# Patient Record
Sex: Male | Born: 1965 | Race: Black or African American | Hispanic: No | Marital: Married | Smoking: Current every day smoker
Health system: Southern US, Community
[De-identification: ages and names within clinical notes are randomized; demographics above are authoritative.]

---

## 2001-01-01 ENCOUNTER — Emergency Department (HOSPITAL_COMMUNITY): Admission: EM | Admit: 2001-01-01 | Discharge: 2001-01-01 | Payer: Self-pay | Admitting: *Deleted

## 2005-08-25 ENCOUNTER — Ambulatory Visit: Payer: Self-pay | Admitting: Family Medicine

## 2005-08-25 ENCOUNTER — Ambulatory Visit (HOSPITAL_COMMUNITY): Admission: RE | Admit: 2005-08-25 | Discharge: 2005-08-25 | Payer: Self-pay | Admitting: Family Medicine

## 2007-02-02 IMAGING — CR DG CHEST 2V
2 series · 2 of 2 positions shown · non-contrast
Comparison: none

HISTORY: Cough, congestion, wheezing

CHEST 2 VIEWS:
No prior study for comparison
Normal heart size, mediastinal contours, and vascularity.
Minimal bronchitic changes
No infiltrate, effusion, or pneumothorax.
Bones unremarkable.

[view not recorded (1 of 2)]
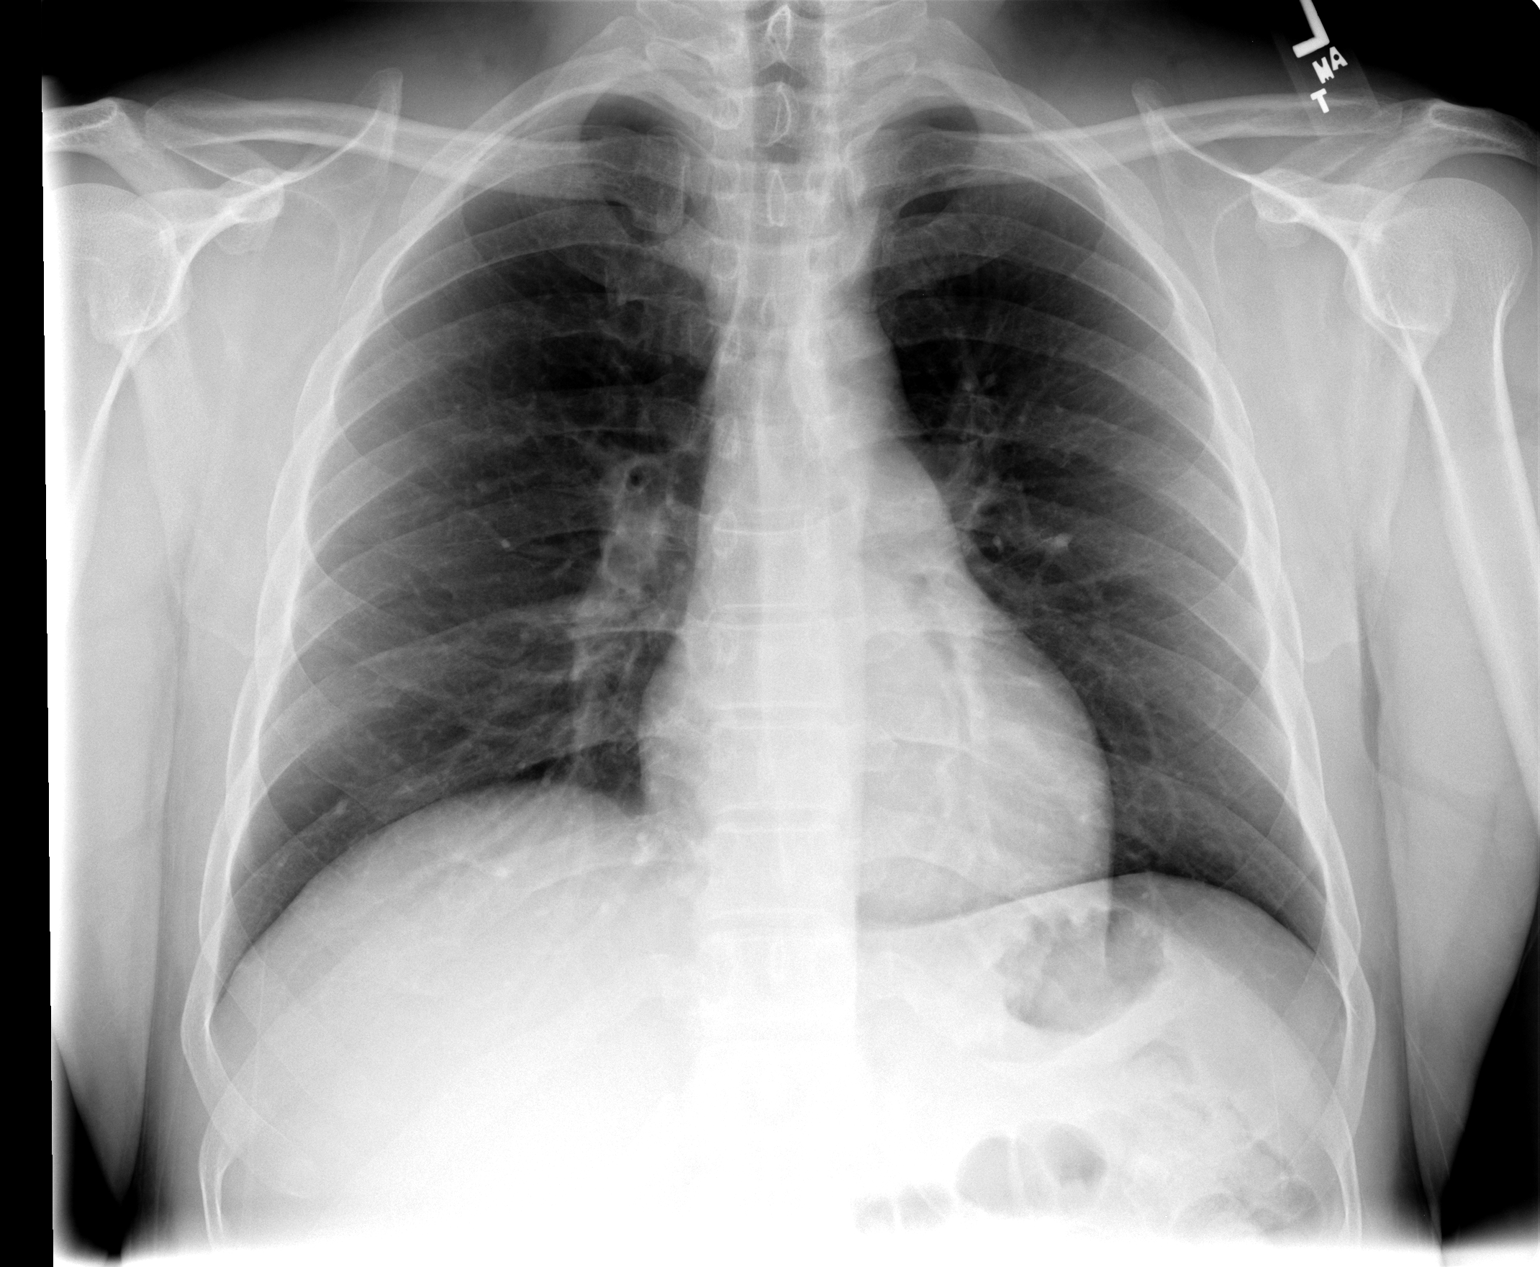

[view not recorded (2 of 2)]
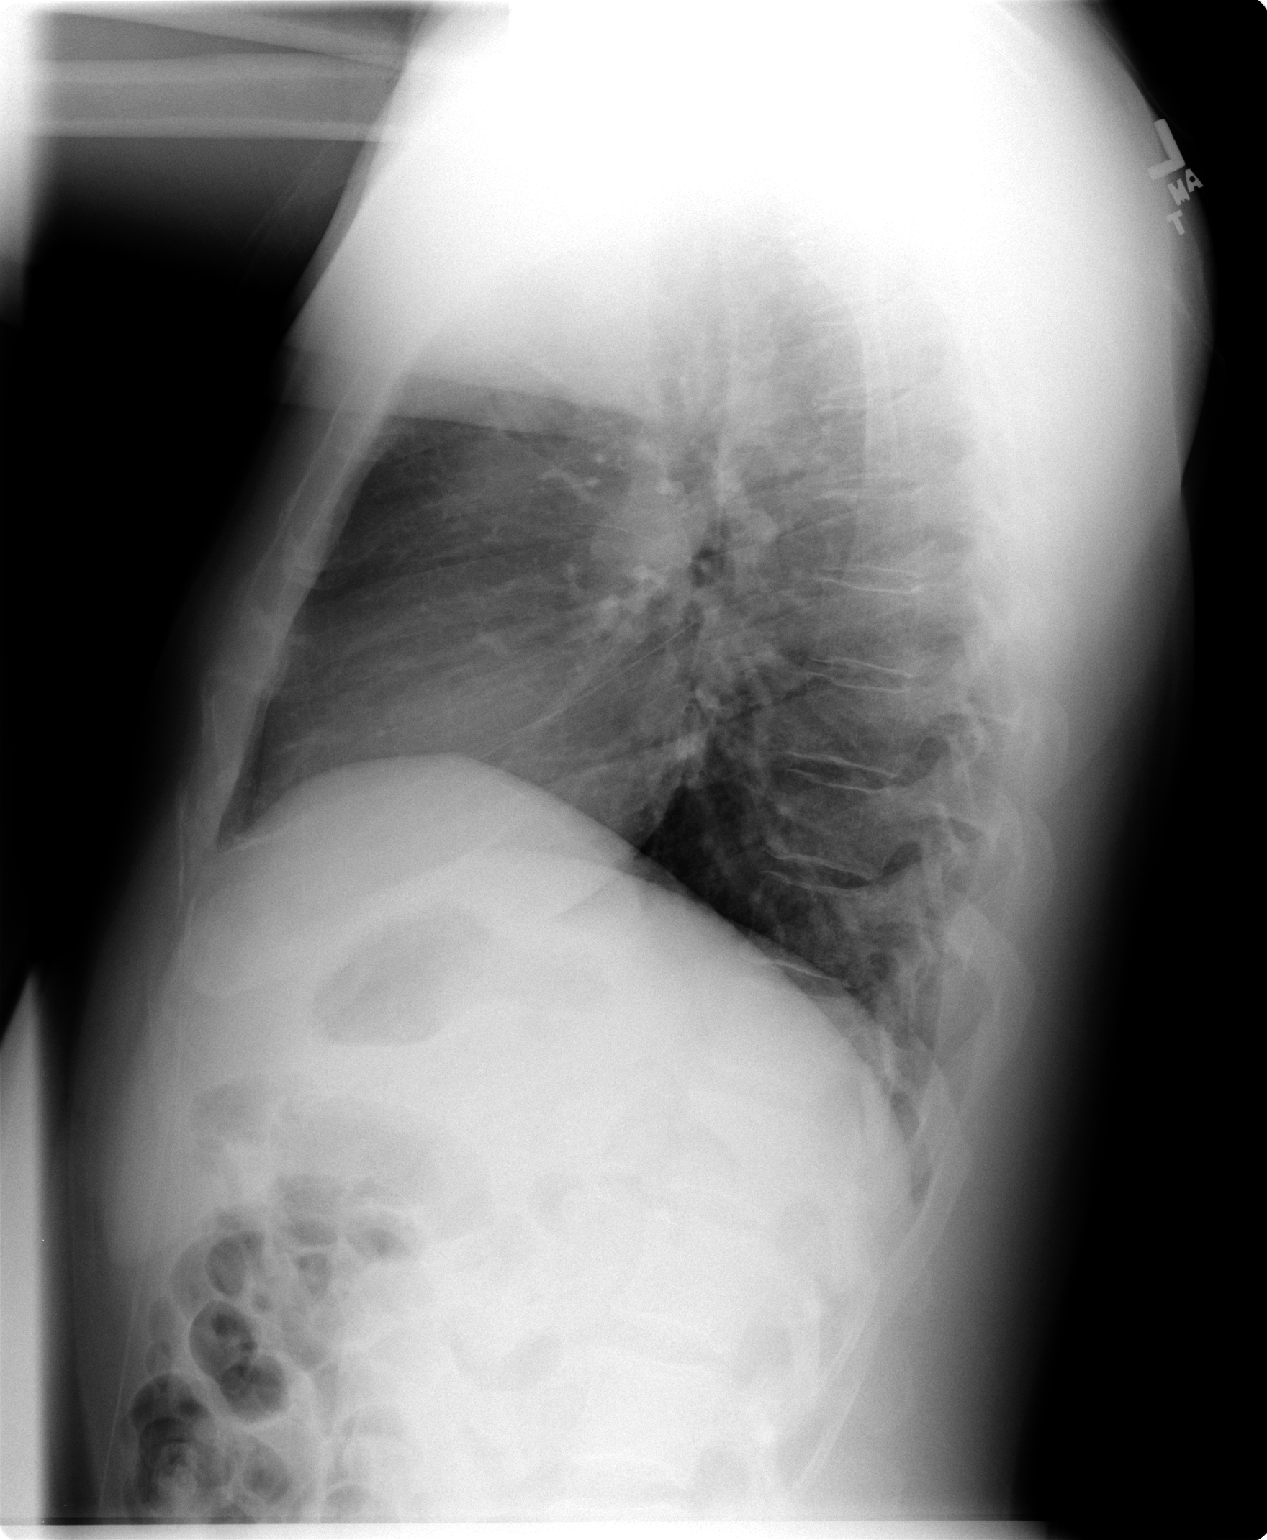

[2 of 2 positions shown; findings below may reference images not displayed]

IMPRESSION: Minimal bronchitic changes without infiltrate.

## 2007-09-19 ENCOUNTER — Encounter: Payer: Self-pay | Admitting: Family Medicine

## 2010-10-20 NOTE — Letter (Signed)
Summary: rpc chart  rpc chart   Imported By: Curtis Sites 06/30/2010 15:25:08  _____________________________________________________________________  External Attachment:    Type:   Image     Comment:   External Document

## 2015-06-03 ENCOUNTER — Encounter (HOSPITAL_COMMUNITY): Payer: Self-pay

## 2015-06-03 ENCOUNTER — Emergency Department (HOSPITAL_COMMUNITY)
Admission: EM | Admit: 2015-06-03 | Discharge: 2015-06-03 | Disposition: A | Discharge status: Home / Self Care | Payer: Self-pay | Attending: Emergency Medicine | Admitting: Emergency Medicine

## 2015-06-03 DIAGNOSIS — K029 Dental caries, unspecified: Secondary | ICD-10-CM | POA: Insufficient documentation

## 2015-06-03 DIAGNOSIS — Z720 Tobacco use: Secondary | ICD-10-CM | POA: Insufficient documentation

## 2015-06-03 DIAGNOSIS — K088 Other specified disorders of teeth and supporting structures: Secondary | ICD-10-CM | POA: Insufficient documentation

## 2015-06-03 DIAGNOSIS — R202 Paresthesia of skin: Secondary | ICD-10-CM | POA: Insufficient documentation

## 2015-06-03 DIAGNOSIS — K0889 Other specified disorders of teeth and supporting structures: Secondary | ICD-10-CM

## 2015-06-03 MED ORDER — PENICILLIN V POTASSIUM 500 MG PO TABS: 500.0000 mg | ORAL_TABLET | Freq: Four times a day (QID) | ORAL | Status: AC

## 2015-06-03 MED ORDER — PENICILLIN V POTASSIUM 250 MG PO TABS
500.0000 mg | ORAL_TABLET | Freq: Once | ORAL | Status: AC
  Administered 2015-06-03: 500 mg via ORAL
  Filled 2015-06-03: qty 2

## 2015-06-03 MED ORDER — TRAMADOL HCL 50 MG PO TABS
50.0000 mg | ORAL_TABLET | Freq: Once | ORAL | Status: AC
  Administered 2015-06-03: 50 mg via ORAL
  Filled 2015-06-03: qty 1

## 2015-06-03 MED ORDER — TRAMADOL HCL 50 MG PO TABS: 50.0000 mg | ORAL_TABLET | Freq: Four times a day (QID) | ORAL | Status: DC | PRN

## 2015-06-03 NOTE — ED Notes (Addendum)
Pt reports he's had a toothache X2dys and this morning his left arm went numb. No neuro deficits

## 2015-06-03 NOTE — ED Provider Notes (Signed)
CSN: [id number]     Arrival date & time 06/03/15  4098 History  This chart was scribed for Justin Octave, MD by Phillis Haggis, ED Scribe. This patient was seen in room APA08/APA08 and patient care was started at 10:27 AM.    Chief Complaint  Patient presents with  . Dental Pain   The history is provided by the patient. No language interpreter was used.   HPI Comments: Justin Compton is a 49 y.o. male who presents to the Emergency Department complaining of upper and lower dental pain onset two days ago. Pt reports associated subjective fever and reports constant tingling of the middle, ring and pinky fingers of the left hand onset this morning. Says that he woke up with the tingling in his fingers and did not experience tingling last night. Reports taking Advil for tooth pain to no relief. He states that he is right hand dominant. Pt denies having a dentist. Pt denies fall, injury, hx of DM, HTN, blurred vision, neck pain, weakness, chest pain, nausea, vomiting, SOB, chills, headache, or dizziness. Denies allergies to medications.   History reviewed. No pertinent past medical history. History reviewed. No pertinent past surgical history. No family history on file. Social History  Substance Use Topics  . Smoking status: Current Every Day Smoker -- 1.00 packs/day    Types: Cigarettes  . Smokeless tobacco: None  . Alcohol Use: Yes    Review of Systems A complete 10 system review of systems was obtained and all systems are negative except as noted in the HPI and PMH.   Allergies  Review of patient's allergies indicates no known allergies.  Home Medications   Prior to Admission medications   Medication Sig Start Date End Date Taking? Authorizing Provider  penicillin v potassium (VEETID) 500 MG tablet Take 1 tablet (500 mg total) by mouth 4 (four) times daily. 06/03/15 06/10/15  Justin Octave, MD  traMADol (ULTRAM) 50 MG tablet Take 1 tablet (50 mg total) by mouth every 6 (six) hours  as needed. 06/03/15   Justin Octave, MD   BP 164/119 mmHg  Pulse 77  Temp(Src) 99.1 F (37.3 C) (Oral)  Resp 18  Ht  (1.727 m)  Wt 170 lb (77.111 kg)  BMI 25.85 kg/m2  SpO2 100%  Physical Exam  Constitutional: He is oriented to person, place, and time. He appears well-developed and well-nourished. No distress.  HENT:  Head: Normocephalic and atraumatic.  Mouth/Throat: Oropharynx is clear and moist. No oropharyngeal exudate.  Right upper and lower molar with caries, no abscess. Floor of mouth soft. Able to touch tongue to roof of mouth.   Eyes: Conjunctivae and EOM are normal. Pupils are equal, round, and reactive to light.  Neck: Normal range of motion. Neck supple.  No meningismus.  Cardiovascular: Normal rate, regular rhythm, normal heart sounds and intact distal pulses.   No murmur heard. Pulmonary/Chest: Effort normal and breath sounds normal. No respiratory distress.  Abdominal: Soft. There is no tenderness. There is no rebound and no guarding.  Musculoskeletal: Normal range of motion. He exhibits no edema or tenderness.  Neurological: He is alert and oriented to person, place, and time. No cranial nerve deficit. He exhibits normal muscle tone. Coordination normal.  No ataxia on finger to nose bilaterally. No pronator drift. 5/5 strength throughout. CN 2-12 intact. Negative Romberg. Equal grip strength. Sensation intact. Gait is normal. Intact radial pulse, full ROM of left wrist, Cardinal hand movements intact. Subjective paresthesias to left 4th and 5th  digits only.   Skin: Skin is warm.  Psychiatric: He has a normal mood and affect. His behavior is normal.  Nursing note and vitals reviewed.   ED Course  Procedures (including critical care time) DIAGNOSTIC STUDIES: Oxygen Saturation is 100% on RA, normal by my interpretation.    COORDINATION OF CARE: 10:32 AM-Discussed treatment plan which includes pain medication, anti-biotics and follow up with dentist with pt at  bedside and pt agreed to plan.   Labs Review Labs Reviewed - No data to display  Imaging Review No results found.    EKG Interpretation None      MDM   Final diagnoses:  Pain, dental  Paresthesias   right-sided toothache for 2 days. No fever or vomiting. This morning started to notice tingling in the fourth and fifth fingers of his left hand. No trauma. Tingling does not extend past hand. There is no weakness. No neck, head or back pain.  No evidence of ludwig's angina or abscess.  Tingling in left hand appears to be peripheral. Doubt ACS, doubt stroke, doubt TIA.  Patient needs to follow-up with dentistry. Return Precautions discussed.   I personally performed the services described in this documentation, which was scribed in my presence. The recorded information has been reviewed and is accurate.    Justin Octave, MD 06/03/15 [id number]

## 2015-06-03 NOTE — Discharge Instructions (Signed)
Dental Pain °A tooth ache may be caused by cavities (tooth decay). Cavities expose the nerve of the tooth to air and hot or cold temperatures. It may come from an infection or abscess (also called a boil or furuncle) around your tooth. It is also often caused by dental caries (tooth decay). This causes the pain you are having. °DIAGNOSIS  °Your caregiver can diagnose this problem by exam. °TREATMENT  °· If caused by an infection, it may be treated with medications which kill germs (antibiotics) and pain medications as prescribed by your caregiver. Take medications as directed. °· Only take over-the-counter or prescription medicines for pain, discomfort, or fever as directed by your caregiver. °· Whether the tooth ache today is caused by infection or dental disease, you should see your dentist as soon as possible for further care. °SEEK MEDICAL CARE IF: °The exam and treatment you received today has been provided on an emergency basis only. This is not a substitute for complete medical or dental care. If your problem worsens or new problems (symptoms) appear, and you are unable to meet with your dentist, call or return to this location. °SEEK IMMEDIATE MEDICAL CARE IF:  °· You have a fever. °· You develop redness and swelling of your face, jaw, or neck. °· You are unable to open your mouth. °· You have severe pain uncontrolled by pain medicine. °MAKE SURE YOU:  °· Understand these instructions. °· Will watch your condition. °· Will get help right away if you are not doing well or get worse. °Document Released: 09/04/2005 Document Revised: 11/27/2011 Document Reviewed: 04/22/2008 °ExitCare® Patient Information ©2015 [employer]. This information is not intended to replace advice given to you by your health care provider. Make sure you discuss any questions you have with your health care provider. ° °Emergency Department Resource Guide °1) Find a Doctor and Pay Out of Pocket °Although you won't have to find out who  is covered by your insurance plan, it is a good idea to ask around and get recommendations. You will then need to call the office and see if the doctor you have chosen will accept you as a new patient and what types of options they offer for patients who are self-pay. Some doctors offer discounts or will set up payment plans for their patients who do not have insurance, but you will need to ask so you aren't surprised when you get to your appointment. ° °2) Contact Your Local Health Department °Not all health departments have doctors that can see patients for sick visits, but many do, so it is worth a call to see if yours does. If you don't know where your local health department is, you can check in your phone book. The CDC also has a tool to help you locate your state's health department, and many state websites also have listings of all of their local health departments. ° °3) Find a Walk-in Clinic °If your illness is not likely to be very severe or complicated, you may want to try a walk in clinic. These are popping up all over the country in pharmacies, drugstores, and shopping centers. They're usually staffed by nurse practitioners or physician assistants that have been trained to treat common illnesses and complaints. They're usually fairly quick and inexpensive. However, if you have serious medical issues or chronic medical problems, these are probably not your best option. ° °No Primary Care Doctor: °- Call [facility] at  832-8000 - they can help you locate a primary   care doctor that  accepts your insurance, provides certain services, etc. °- Physician Referral Service- 1-800-533-3463 ° °Chronic Pain Problems: °Organization         Address  Phone   Notes  °[facility]  (336) 297-2271 Patients need to be referred by their primary care doctor.  ° °Medication Assistance: °Organization         Address  Phone   Notes  °[place] [facility] [address]  [address] °[place], [place] [address] (336) 641-8030 --Must be a resident of [place] °-- Must have NO insurance coverage whatsoever (no Medicaid/ Medicare, etc.) °-- The pt. MUST have a primary care doctor that directs their care regularly and follows them in the community °  °[facility]  (866) 331-1348   °[employer]  (888) 892-1162   ° °Agencies that provide inexpensive medical care: °Organization         Address  Phone   Notes  °[facility]  (336) 832-8035   °[facility]    (336) 832-7272   °[facility] [address] °[place], [place] [address] (336) 832-4777   °[facility] [address], °[place] (336) 271-4999   °[facility]    (336) 373-0678   °[facility]    (336) 272-1050   °[facility] ° [address], [place] Phone:  (336) 832-4444, Fax:  (336) 832-4440 Hours of Operation:  9 am - 6 pm, M-F.  Also accepts Medicaid/Medicare and self-pay.  °[facility] ° [address], [place] Phone: (336) 832-3150, Fax: (336) 832-3151. Hours of Operation:  8:30 am - 5:30 pm, M-F.  Also accepts Medicaid and self-pay.  °[facility] [place] [address], [place] Phone: (336) 878-6027   °[facility] [address], [place], [place] (336)723-1848, Ext. 123 Mondays & Thursdays: 7-9 AM.  First 15 patients are seen on a first come, first serve basis. °  ° °Medicaid-accepting [place] Providers: ° °Organization         Address  Phone   Notes  °[facility] [address], [place] (336) 641-2100 Also accepts self-pay patients.  °[facility] [address], [place] ° (336) 856-9996   °[facility] [address], [place] (336) 288-8857   °[facility] [address] [place] [address], [place] (336) 299-7000   °Veita Bland [address], [place]  ° (336) 373-1557 Only accepts [place] Access Medicaid patients after they have their name applied to their card.  ° °Self-Pay (no insurance) in [place]: ° °Organization         Address  Phone   Notes  °Sickle Cell Patients, [facility] [address], [place] (336) 832-1970   °[facility] [address], [place] (336) 832-4400   °[facility] ° [address], [place] (336) 992-4800   °[facility]/Dr. Osei-Bonsu ° [address] [place] [address], [place] or [address], [place] (336) 841-8500 Phone number for both [place] and [place] locations is the same.  °[facility] [address], [place] (336) 299-0000   °[facility]  [address] [place] [address],  or [address] (336) 852-7530 °(336) 878-2260   °  [facility] [address], [place] (336) 350-1642, phone; (336) 294-5005, fax Sees patients 1st and 3rd Saturday of every month.  Must not qualify for public or private insurance (i.e. Medicaid, Medicare, [place] Health Choice, Veterans' Benefits) • Household income should be no more than 200% of the poverty level •The clinic cannot treat you if you are pregnant or think you are pregnant • Sexually transmitted diseases are not treated at the clinic.  ° ° °Dental Care: °Organization         Address  Phone  Notes  °[facility] [address], [place] (336) 641-6152 Accepts children up to age 21 who are enrolled in Medicaid or [place] Health Choice; pregnant women with a Medicaid card; and children who have applied for Medicaid or [place] Health Choice, but were declined, whose parents can pay a reduced fee at time of service.  °[facility] [place]  [address], [place] (336) 641-7733 Accepts children up to age 21 who are  enrolled in Medicaid or [place] Health Choice; pregnant women with a Medicaid card; and children who have applied for Medicaid or [place] Health Choice, but were declined, whose parents can pay a reduced fee at time of service.  °[facility] ° [address], [place] (336) 641-4533 Patients are seen by appointment only. Walk-ins are not accepted. [facility] will see patients 18 years of age and older. °Monday - Tuesday (8am-5pm) °Most Wednesdays (8:30-5pm) °$30 per visit, cash only  °[facility] ° [address], [place] (336) 641-4533 Patients are seen by appointment only. Walk-ins are not accepted. [facility] will see patients 18 years of age and older. °One Wednesday Evening (Monthly: Volunteer Based).  $30 per visit, cash only  °[facility]  (919) 537-3737 for adults; Children under age 4, call Graduate Pediatric Dentistry at (919) [phone]. Children aged 4-14, please call (919) [phone] to request a pediatric application. ° Dental services are provided in all areas of dental care including fillings, crowns and bridges, complete and partial dentures, implants, gum treatment, root canals, and extractions. Preventive care is also provided. Treatment is provided to both adults and children. °Patients are selected via a lottery and there is often a waiting list. °  °[facility] [address], ° ° (336) 763-8833 [email or web address] °  °[facility] [address], [place], [place] (336)723-1848, Ext. 123 Second and Fourth Thursday of each month, opens at 6:30 AM; Clinic ends at 9 AM.  Patients are seen on a first-come first-served basis, and a limited number are seen during each clinic.  ° °[facility] ° [address], [place], [place] (336) 723-7904   Eligibility Requirements °You must have lived in [place], [place], or [place] counties for at least the last three months. °  You  cannot be eligible for state or federal sponsored healthcare insurance, including Veterans Administration, Medicaid, or Medicare. °  You generally cannot be eligible for healthcare insurance through your employer.  °  How to apply: °Eligibility screenings are held every Tuesday and Wednesday afternoon from 1:00 pm until 4:00 pm. You do not need an appointment for the interview!  °[facility] [address], [place], [place] 336-631-2330   °[facility] [facility]  336-342-8273   °[facility]  336-703-3100   °[facility]  336-570-6415   ° °Behavioral Health Resources in the Community: °Intensive Outpatient Programs °Organization         Address  Phone  Notes  °[facility] [address], [place], [place] 336-878-6098   °[facility] [address], [place], [place] 336-832-9800   °[facility] [address], [place], [place] ° 336-882-2125   °[facility] [address],  °[place], [place] 1-800-853-5163 or 336-641-4981   °Substance Abuse Resources °Organization         Address  Phone  Notes  °[facility]  336-882-2125   °[facility]  336-784-9470   °[facility]  336-285-9073   °[facility]  336-845-3988   °Residential & Outpatient Substance Abuse Program  1-800-659-3381   °Psychological Services °Organization         Address  Phone  Notes  °[facility]  336- 832-9600   °[facility]  336- 378-7881   °[facility] [address], [place] 1-800-853-5163 or 336-641-4981   ° °Mobile Crisis Teams °Organization         Address  Phone  Notes  °[facility], Mobile Crisis Care Unit  1-877-626-1772   °Assertive °[facility] ° [address] [place], [place] 336-834-9664   °Sharon DeEsch [address] °[place] 336-554-5454   ° °Self-Help/Support  Groups °Organization         Address  Phone             Notes  °[facility] - variety of support groups  336- 373-1402 Call for more information  °Narcotics Anonymous (NA), [facility] [address], °[place]  2 meetings at this location  ° °Residential Treatment Programs °Organization         Address  Phone  Notes  °[facility] [address],    °[place]  1-866-801-8205   °[facility] ° [address], [place], [place] 704-293-8524   °[facility] [address], [place] 336-845-3988 Admissions: 8am-3pm M-F  °[facility] [address]    °[place], [place] 336-841-1104   °[facility] [address], [place], [place] 336-379-7146   °[facility] [address]  °[place], [place] 336-285-9073   °[facility] - Intensive Outpatient [address], [place], [place] 336-852-3033   °[facility] [address]  °[place], [place] 1-877-615-2722 or 336-784-9470   °[facility] [address] [place], [place] 336-227-7417 Accepts Medicaid  °[facility] [address]  ° [place] 1-800-659-3381 Substance Abuse/Addiction Treatment  ° °[place] Behavioral Health Resources °Organization         Address  Phone  Notes  °[facility]  (888) 581-9988   °Julie Brannon, PhD [address] [place], [place]   (336) 349-5553 or (336) 951-0000   °[facility]   [address] °[place], [place] (336) 349-4454   °[facility] [address], [place], [place] (336) 342-8316 Insurance/Medicaid/sponsorship through [facility]  °[facility] [address]                                    [place],  (336) 342-8316 Therapy/tele-psych/case  °[facility]   [address]  ° [place], [place] (336) 349-2233    °Dr. Arfeen  (336) 349-4544   °[facility] [place]  [facility]  [employer] [place] [facility] 1) [address], [place] °2) [address], [place] °3)  [address], [place] (336) 349-3220 °(336) 342-7768 ° °(336) 342-8140   °[place] Child Abuse [facility] (336) 342-1394 or (336) 342-3537 (After Hours)    ° ° ° °

## 2020-01-28 ENCOUNTER — Ambulatory Visit
Admission: EM | Admit: 2020-01-28 | Discharge: 2020-01-28 | Disposition: A | Discharge status: Home / Self Care | Payer: BC Managed Care – PPO | Attending: Family Medicine | Admitting: Family Medicine

## 2020-01-28 ENCOUNTER — Other Ambulatory Visit: Payer: Self-pay

## 2020-01-28 DIAGNOSIS — R197 Diarrhea, unspecified: Secondary | ICD-10-CM

## 2020-01-28 DIAGNOSIS — R5383 Other fatigue: Secondary | ICD-10-CM

## 2020-01-28 DIAGNOSIS — R6883 Chills (without fever): Secondary | ICD-10-CM

## 2020-01-28 DIAGNOSIS — R1084 Generalized abdominal pain: Secondary | ICD-10-CM

## 2020-01-28 DIAGNOSIS — K529 Noninfective gastroenteritis and colitis, unspecified: Secondary | ICD-10-CM

## 2020-01-28 DIAGNOSIS — R509 Fever, unspecified: Secondary | ICD-10-CM

## 2020-01-28 MED ORDER — ONDANSETRON HCL 4 MG PO TABS: 4.0000 mg | ORAL_TABLET | Freq: Four times a day (QID) | ORAL | 0 refills | Status: DC

## 2020-01-28 MED ORDER — LOPERAMIDE HCL 2 MG PO CAPS: 2.0000 mg | ORAL_CAPSULE | Freq: Four times a day (QID) | ORAL | 0 refills | Status: DC | PRN

## 2020-01-28 NOTE — ED Provider Notes (Signed)
[facility]   [id number] 01/28/20 Arrival Time: 0820  CC: ABDOMINAL PAIN  SUBJECTIVE:  Justin Compton is a 54 y.o. male who presents with complaint of abdominal discomfort that began abruptly 4 days ago.  Denies a precipitating event, trauma, recent travel or antibiotic use. Reports sick contact (daughter that lives in the home). Describes as intermittent and dull/achy in character.  Has not tried OTC medications. Denies alleviating or aggravating factors.  Denies similar symptoms in the past. Last BM today. Reports chills, nausea, vomiting, diarrhea.    Denies fever, appetite changes, weight changes, chest pain, SOB, constipation, hematochezia, melena, dysuria, difficulty urinating, increased frequency or urgency, flank pain, loss of bowel or bladder function, vaginal discharge, vaginal odor, vaginal bleeding, dyspareunia, pelvic pain.     No LMP for male patient.  ROS: As per HPI.  All other pertinent ROS negative.     No past medical history on file. No past surgical history on file. No Known Allergies No current facility-administered medications on file prior to encounter.   Current Outpatient Medications on File Prior to Encounter  Medication Sig Dispense Refill  . traMADol (ULTRAM) 50 MG tablet Take 1 tablet (50 mg total) by mouth every 6 (six) hours as needed. 15 tablet 0   Social History   Socioeconomic History  . Marital status: Married    Spouse name: Not on file  . Number of children: Not on file  . Years of education: Not on file  . Highest education level: Not on file  Occupational History  . Not on file  Tobacco Use  . Smoking status: Current Every Day Smoker    Packs/day: 1.00    Types: Cigarettes  Substance and Sexual Activity  . Alcohol use: Yes  . Drug use: Not on file  . Sexual activity: Not on file  Other Topics Concern  . Not on file  Social History Narrative  . Not on file   Social Determinants of Health   Financial Resource  Strain:   . Difficulty of Paying Living Expenses:   Food Insecurity:   . Worried About Programme researcher, broadcasting/film/video in the Last Year:   . Barista in the Last Year:   Transportation Needs:   . Freight forwarder (Medical):   Marland Kitchen Lack of Transportation (Non-Medical):   Physical Activity:   . Days of Exercise per Week:   . Minutes of Exercise per Session:   Stress:   . Feeling of Stress :   Social Connections:   . Frequency of Communication with Friends and Family:   . Frequency of Social Gatherings with Friends and Family:   . Attends Religious Services:   . Active Member of Clubs or Organizations:   . Attends Banker Meetings:   Marland Kitchen Marital Status:   Intimate Partner Violence:   . Fear of Current or Ex-Partner:   . Emotionally Abused:   Marland Kitchen Physically Abused:   . Sexually Abused:    No family history on file.   OBJECTIVE:  Vitals:   01/28/20 0845  BP: (!) 134/95  Pulse: 82  Resp: 16  Temp: 98.1 F (36.7 C)  TempSrc: Oral  SpO2: 98%    General appearance: Alert; NAD HEENT: NCAT.  Oropharynx clear.  Lungs: clear to auscultation bilaterally without adventitious breath sounds Heart: regular rate and rhythm.  Radial pulses 2+ symmetrical bilaterally Abdomen: soft, non-distended; increased active bowel sounds; non-tender to light and deep palpation; nontender at McBurney's point; negative  Murphy's sign; negative rebound; no guarding Back: no CVA tenderness Extremities: no edema; symmetrical with no gross deformities Skin: warm and dry Neurologic: normal gait Psychological: alert and cooperative; normal mood and affect  LABS: No results found for this or any previous visit (from the past 24 hour(s)).  DIAGNOSTIC STUDIES: No results found.   ASSESSMENT & PLAN:  1. Fever, unspecified fever cause   2. Diarrhea, unspecified type   3. Other fatigue   4. Chills   5. Noninfectious gastroenteritis, unspecified type   6. Generalized abdominal pain     Meds  ordered this encounter  Medications  . loperamide (IMODIUM) 2 MG capsule    Sig: Take 1 capsule (2 mg total) by mouth 4 (four) times daily as needed for diarrhea or loose stools.    Dispense:  12 capsule    Refill:  0    Order Specific Question:   Supervising Provider    Answer:   Chase Picket [id number]  . ondansetron (ZOFRAN) 4 MG tablet    Sig: Take 1 tablet (4 mg total) by mouth every 6 (six) hours.    Dispense:  12 tablet    Refill:  0    Order Specific Question:   Supervising Provider    Answer:   Chase Picket [id number]     Get rest and drink fluids Zofran prescribed.  Take as directed.   Loperamide prescribed.   DIET Instructions:  30 minutes after taking nausea medicine, begin with sips of clear liquids. If able to hold down 2 - 4 ounces for 30 minutes, begin drinking more. Increase your fluid intake to replace losses. Clear liquids only for 24 hours (water, tea, sport drinks, clear flat ginger ale or cola and juices, broth, jello, popsicles, ect). Advance to bland foods, applesauce, rice, baked or boiled chicken, ect. Avoid milk, greasy foods and anything that doesn't agree with you.  If you experience new or worsening symptoms return or go to ER such as fever, chills, nausea, vomiting, diarrhea, bloody or dark tarry stools, constipation, urinary symptoms, worsening abdominal discomfort, symptoms that do not improve with medications, inability to keep fluids down,  Covid swab obtained in office today.  Patient instructed to quarantine until results are back and negative.  If results are negative, patient may resume daily schedule as tolerated once they are fever free for 24 hours without the use of antipyretic medications.  If results are positive, patient instructed to quarantine 10 days from today.  Patient instructed to follow-up with primary care with this office as needed.  Patient instructed to follow-up in the ER for trouble swallowing, trouble breathing, other  concerning symptoms.  Work note provided.  Reviewed expectations re: course of current medical issues. Questions answered. Outlined signs and symptoms indicating need for more acute intervention. Patient verbalized understanding. After Visit Summary given.    Faustino Congress, NP 01/28/20 [phone]

## 2020-01-28 NOTE — Discharge Instructions (Addendum)
Your COVID test is pending.  You should self quarantine until the test result is back.    Take Tylenol as needed for fever or discomfort.  Rest and keep yourself hydrated.    Go to the emergency department if you develop shortness of breath, severe diarrhea, high fever not relieved by Tylenol or ibuprofen, or other concerning symptoms.    

## 2020-01-29 LAB — SARS-COV-2, NAA 2 DAY TAT

## 2020-01-29 LAB — NOVEL CORONAVIRUS, NAA: SARS-CoV-2, NAA: NOT DETECTED

## 2021-02-15 ENCOUNTER — Ambulatory Visit
Admission: EM | Admit: 2021-02-15 | Discharge: 2021-02-15 | Disposition: A | Discharge status: Home / Self Care | Payer: BC Managed Care – PPO | Attending: Family Medicine | Admitting: Family Medicine

## 2021-02-15 ENCOUNTER — Encounter: Payer: Self-pay | Admitting: Emergency Medicine

## 2021-02-15 DIAGNOSIS — R319 Hematuria, unspecified: Secondary | ICD-10-CM

## 2021-02-15 DIAGNOSIS — R109 Unspecified abdominal pain: Secondary | ICD-10-CM

## 2021-02-15 LAB — POCT URINALYSIS DIP (MANUAL ENTRY)
Bilirubin, UA: NEGATIVE
Glucose, UA: NEGATIVE mg/dL
Leukocytes, UA: NEGATIVE
Nitrite, UA: NEGATIVE
Spec Grav, UA: 1.02 (ref 1.010–1.025)
Urobilinogen, UA: 0.2 E.U./dL
pH, UA: 6 (ref 5.0–8.0)

## 2021-02-15 MED ORDER — CYCLOBENZAPRINE HCL 10 MG PO TABS: 10.0000 mg | ORAL_TABLET | Freq: Two times a day (BID) | ORAL | 0 refills | Status: DC | PRN

## 2021-02-15 MED ORDER — TAMSULOSIN HCL 0.4 MG PO CAPS: 0.4000 mg | ORAL_CAPSULE | Freq: Every day | ORAL | 0 refills | Status: DC

## 2021-02-15 MED ORDER — IBUPROFEN 800 MG PO TABS: 800.0000 mg | ORAL_TABLET | Freq: Three times a day (TID) | ORAL | 0 refills | Status: DC | PRN

## 2021-02-15 NOTE — Discharge Instructions (Addendum)
You may have a kidney stone  I have sent in flomax for you to take daily  I have also sent in ibuprofrn 800mg  one tablet every 8 hours as needed for pain  I have sent in flexeril for you to take twice a day as needed for muscle spasms. This medication can make you sleepy. Do not drive or operate heavy machinery with this medication.  Follow up with this office or with primary care if symptoms are persisting.  Follow up in the ER for high fever, trouble swallowing, trouble breathing, other concerning symptoms.

## 2021-02-15 NOTE — ED Provider Notes (Addendum)
[facility]   CC: UTI  SUBJECTIVE:  Justin Compton is a 55 y.o. male who complains of dark urine and right flank pain for the past 4 days. Patient denies a precipitating event, recent sexual encounter, excessive caffeine intake. Localizes the pain to the right flank. Pain is intermittent and describes it as sharp. Has not tried OTC medications without relief. Symptoms are made worse with urination, and with movement and lifting. Denies similar symptoms in the past. Denies fever, chills, nausea, vomiting, abdominal pain, abnormal vaginal discharge or bleeding, hematuria.    ROS: As in HPI.  All other pertinent ROS negative.     History reviewed. No pertinent past medical history. History reviewed. No pertinent surgical history. No Known Allergies No current facility-administered medications on file prior to encounter.   Current Outpatient Medications on File Prior to Encounter  Medication Sig Dispense Refill  . loperamide (IMODIUM) 2 MG capsule Take 1 capsule (2 mg total) by mouth 4 (four) times daily as needed for diarrhea or loose stools. 12 capsule 0  . ondansetron (ZOFRAN) 4 MG tablet Take 1 tablet (4 mg total) by mouth every 6 (six) hours. 12 tablet 0  . traMADol (ULTRAM) 50 MG tablet Take 1 tablet (50 mg total) by mouth every 6 (six) hours as needed. 15 tablet 0   Social History   Socioeconomic History  . Marital status: Married    Spouse name: Not on file  . Number of children: Not on file  . Years of education: Not on file  . Highest education level: Not on file  Occupational History  . Not on file  Tobacco Use  . Smoking status: Current Every Day Smoker    Packs/day: 1.00    Types: Cigarettes  . Smokeless tobacco: Never Used  Substance and Sexual Activity  . Alcohol use: Yes  . Drug use: Never  . Sexual activity: Not on file  Other Topics Concern  . Not on file  Social History Narrative  . Not on file   Social Determinants of Health   Financial  Resource Strain: Not on file  Food Insecurity: Not on file  Transportation Needs: Not on file  Physical Activity: Not on file  Stress: Not on file  Social Connections: Not on file  Intimate Partner Violence: Not on file   No family history on file.  OBJECTIVE:  Vitals:   02/15/21 1717  BP: (!) 162/96  Pulse: 66  Resp: 18  Temp: 98.2 F (36.8 C)  TempSrc: Oral  SpO2: 97%   General appearance: AOx3 in no acute distress HEENT: NCAT. Oropharynx clear.  Lungs: clear to auscultation bilaterally without adventitious breath sounds Heart: regular rate and rhythm. Radial pulses 2+ symmetrical bilaterally Abdomen: soft; non-distended; no tenderness; bowel sounds present; no guarding or rebound tenderness Back: right CVA tenderness, right low back tenderness, no swelling, bruising, erythema noted Extremities: no edema; symmetrical with no gross deformities Skin: warm and dry Neurologic: Ambulates from chair to exam table without difficulty Psychological: alert and cooperative; normal mood and affect  Labs Reviewed  POCT URINALYSIS DIP (MANUAL ENTRY) - Abnormal; Notable for the following components:      Result Value   Clarity, UA hazy (*)    Ketones, POC UA trace (5) (*)    Blood, UA trace-lysed (*)    Protein Ur, POC trace (*)    All other components within normal limits    ASSESSMENT & PLAN:  1. Hematuria, unspecified type   2. Right  flank pain     Meds ordered this encounter  Medications  . ibuprofen (ADVIL) 800 MG tablet    Sig: Take 1 tablet (800 mg total) by mouth every 8 (eight) hours as needed for moderate pain.    Dispense:  21 tablet    Refill:  0    Order Specific Question:   Supervising Provider    Answer:   Merrilee Jansky [id number]  . cyclobenzaprine (FLEXERIL) 10 MG tablet    Sig: Take 1 tablet (10 mg total) by mouth 2 (two) times daily as needed for muscle spasms.    Dispense:  20 tablet    Refill:  0    Order Specific Question:   Supervising  Provider    Answer:   Merrilee Jansky [id number]  . tamsulosin (FLOMAX) 0.4 MG CAPS capsule    Sig: Take 1 capsule (0.4 mg total) by mouth daily.    Dispense:  30 capsule    Refill:  0    Order Specific Question:   Supervising Provider    Answer:   Merrilee Jansky [id number]    Renal calculi vs muscle strain Flomax prescribed Ibuprofen prescribed Flexeril prescribed Sedation precautions given Work note provided Follow up with PCP if symptoms persists Return here or go to ER if you have any new or worsening symptoms such as fever, worsening abdominal pain, nausea/vomiting, flank pain  Outlined signs and symptoms indicating need for more acute intervention Patient verbalized understanding After Visit Summary given     Moshe Cipro, NP 02/15/21 1913    Moshe Cipro, NP 02/20/21 1634

## 2021-02-15 NOTE — ED Triage Notes (Signed)
r flank pain x4 days urinary discoloration

## 2022-12-18 DIAGNOSIS — N200 Calculus of kidney: Secondary | ICD-10-CM

## 2022-12-18 HISTORY — DX: Calculus of kidney: N20.0

## 2023-07-26 ENCOUNTER — Ambulatory Visit: Payer: BC Managed Care – PPO | Admitting: Internal Medicine

## 2023-09-05 ENCOUNTER — Encounter: Payer: Self-pay | Admitting: Internal Medicine

## 2023-09-05 ENCOUNTER — Ambulatory Visit: Payer: BC Managed Care – PPO | Admitting: Internal Medicine

## 2023-09-05 VITALS — BP 162/96 | HR 70 | Ht 66.0 in | Wt 174.0 lb

## 2023-09-05 DIAGNOSIS — Z1321 Encounter for screening for nutritional disorder: Secondary | ICD-10-CM

## 2023-09-05 DIAGNOSIS — Z114 Encounter for screening for human immunodeficiency virus [HIV]: Secondary | ICD-10-CM | POA: Diagnosis not present

## 2023-09-05 DIAGNOSIS — Z1329 Encounter for screening for other suspected endocrine disorder: Secondary | ICD-10-CM

## 2023-09-05 DIAGNOSIS — Z1159 Encounter for screening for other viral diseases: Secondary | ICD-10-CM | POA: Diagnosis not present

## 2023-09-05 DIAGNOSIS — Z13220 Encounter for screening for lipoid disorders: Secondary | ICD-10-CM | POA: Diagnosis not present

## 2023-09-05 DIAGNOSIS — Z720 Tobacco use: Secondary | ICD-10-CM | POA: Insufficient documentation

## 2023-09-05 DIAGNOSIS — Z23 Encounter for immunization: Secondary | ICD-10-CM

## 2023-09-05 DIAGNOSIS — F1090 Alcohol use, unspecified, uncomplicated: Secondary | ICD-10-CM | POA: Diagnosis not present

## 2023-09-05 DIAGNOSIS — R030 Elevated blood-pressure reading, without diagnosis of hypertension: Secondary | ICD-10-CM | POA: Diagnosis not present

## 2023-09-05 DIAGNOSIS — Z1211 Encounter for screening for malignant neoplasm of colon: Secondary | ICD-10-CM | POA: Insufficient documentation

## 2023-09-05 DIAGNOSIS — Z131 Encounter for screening for diabetes mellitus: Secondary | ICD-10-CM | POA: Diagnosis not present

## 2023-09-05 NOTE — Assessment & Plan Note (Signed)
No prior history of hypertension.  BP is elevated today, 158/92 initially and 162/96 on repeat. -Follow-up in 4 to 6 weeks for BP check.  Will plan to start antihypertensive treatment at that time if BP remains elevated.

## 2023-09-05 NOTE — Progress Notes (Signed)
New Patient Office Visit  Subjective    Patient ID: Justin Compton, male    DOB: 1966/01/03  Age: 57 y.o. MRN: [id number]  CC:  Chief Complaint  Patient presents with   Establish Care    Patient is here to establish care. Complains of back pain that comes and goes, blurry vision.     HPI Justin Compton presents to establish care.  He is a 57 year old male with no significant past medical history.  Not taking any prescription medications currently.  Justin Compton reports feeling well today.  He is asymptomatic and has no acute concerns to discuss aside from desiring to establish care.  He currently works for [employer]  He endorses current tobacco use, smoking 1 pack/day of cigarettes and has been smoking since age 24.  He reports daily alcohol consumption, drinking 1-2 beers daily and consuming fifth of liquor over the course of the weekend.  He additionally endorses occasional marijuana use.  Family medical history is significant for diabetes mellitus, HTN, and liver cancer (father).   Outpatient Encounter Medications as of 09/05/2023  Medication Sig   [DISCONTINUED] cyclobenzaprine (FLEXERIL) 10 MG tablet Take 1 tablet (10 mg total) by mouth 2 (two) times daily as needed for muscle spasms.   [DISCONTINUED] ibuprofen (ADVIL) 800 MG tablet Take 1 tablet (800 mg total) by mouth every 8 (eight) hours as needed for moderate pain.   [DISCONTINUED] loperamide (IMODIUM) 2 MG capsule Take 1 capsule (2 mg total) by mouth 4 (four) times daily as needed for diarrhea or loose stools.   [DISCONTINUED] ondansetron (ZOFRAN) 4 MG tablet Take 1 tablet (4 mg total) by mouth every 6 (six) hours.   [DISCONTINUED] tamsulosin (FLOMAX) 0.4 MG CAPS capsule Take 1 capsule (0.4 mg total) by mouth daily.   [DISCONTINUED] traMADol (ULTRAM) 50 MG tablet Take 1 tablet (50 mg total) by mouth every 6 (six) hours as needed.   No facility-administered encounter medications on file as of 09/05/2023.    Past  Medical History:  Diagnosis Date   Kidney stones 12/2022    History reviewed. No pertinent surgical history.  Family History  Problem Relation Age of Onset   Diabetes Mother    Hypertension Mother    Liver cancer Father    Hypertension Father    Diabetes Sister    Hypertension Brother    Diabetes Maternal Grandmother    Diabetes Maternal Grandfather    Lung disease Paternal Grandfather     Social History   Socioeconomic History   Marital status: Married    Spouse name: Not on file   Number of children: Not on file   Years of education: Not on file   Highest education level: Not on file  Occupational History   Not on file  Tobacco Use   Smoking status: Every Day    Current packs/day: 1.00    Average packs/day: 1 pack/day for 28.8 years (28.8 ttl pk-yrs)    Types: Cigarettes    Start date: 11/17/1994   Smokeless tobacco: Never  Substance and Sexual Activity   Alcohol use: Yes   Drug use: Never   Sexual activity: Not on file  Other Topics Concern   Not on file  Social History Narrative   Not on file   Social Drivers of Health   Financial Resource Strain: Not on file  Food Insecurity: Not on file  Transportation Needs: Not on file  Physical Activity: Not on file  Stress: Not on file  Social Connections: Not on file  Intimate Partner Violence: Not on file   Review of Systems  Constitutional:  Negative for chills and fever.  HENT:  Negative for sore throat.   Respiratory:  Negative for cough and shortness of breath.   Cardiovascular:  Negative for chest pain, palpitations and leg swelling.  Gastrointestinal:  Negative for abdominal pain, blood in stool, constipation, diarrhea, nausea and vomiting.  Genitourinary:  Negative for dysuria and hematuria.  Musculoskeletal:  Negative for myalgias.  Skin:  Negative for itching and rash.  Neurological:  Negative for dizziness and headaches.  Psychiatric/Behavioral:  Negative for depression and suicidal ideas.     Objective    BP (!) 162/96   Pulse 70   Ht 5\' 6"  (1.676 m)   Wt 174 lb (78.9 kg)   SpO2 97%   BMI 28.08 kg/m   Physical Exam Vitals reviewed.  Constitutional:      General: He is not in acute distress.    Appearance: Normal appearance. He is not ill-appearing.  HENT:     Head: Normocephalic and atraumatic.     Right Ear: External ear normal.     Left Ear: External ear normal.     Nose: Nose normal. No congestion or rhinorrhea.     Mouth/Throat:     Mouth: Mucous membranes are moist.     Pharynx: Oropharynx is clear.  Eyes:     General: No scleral icterus.    Extraocular Movements: Extraocular movements intact.     Conjunctiva/sclera: Conjunctivae normal.     Pupils: Pupils are equal, round, and reactive to light.  Cardiovascular:     Rate and Rhythm: Normal rate and regular rhythm.     Pulses: Normal pulses.     Heart sounds: Normal heart sounds. No murmur heard. Pulmonary:     Effort: Pulmonary effort is normal.     Breath sounds: Normal breath sounds. No wheezing, rhonchi or rales.  Abdominal:     General: Abdomen is flat. Bowel sounds are normal. There is no distension.     Palpations: Abdomen is soft.     Tenderness: There is no abdominal tenderness.  Musculoskeletal:        General: No swelling or deformity. Normal range of motion.     Cervical back: Normal range of motion.  Skin:    General: Skin is warm and dry.     Capillary Refill: Capillary refill takes less than 2 seconds.  Neurological:     General: No focal deficit present.     Mental Status: He is alert and oriented to person, place, and time.     Motor: No weakness.  Psychiatric:        Mood and Affect: Mood normal.        Behavior: Behavior normal.        Thought Content: Thought content normal.    Assessment & Plan:   Problem List Items Addressed This Visit       Elevated blood pressure reading   No prior history of hypertension.  BP is elevated today, 158/92 initially and 162/96 on  repeat. -Follow-up in 4 to 6 weeks for BP check.  Will plan to start antihypertensive treatment at that time if BP remains elevated.      Colon cancer screening   He has never been screened for colon cancer.  Denies a family history of colon cancer.  Gastroenterology referral placed today for screening colonoscopy.      Current tobacco use  He currently smokes 1 pack/day of cigarettes and has been smoking since age 62.  He is contemplating cessation and would potentially be interested in trying nicotine patches. -The patient was counseled on the dangers of tobacco use, and was advised to quit.  Reviewed strategies to maximize success, including removing cigarettes and smoking materials from environment, stress management, substitution of other forms of reinforcement, support of family/friends, and written materials. -I recommended choosing a quit date and notifying our office when he is ready to quit.  I am happy to prescribe nicotine replacement therapy at that time. -Meets criteria for lung cancer screening. Will message lung cancer screening program coordinator today.      Need for Tdap vaccination - Primary   Tdap vaccine administered today      Return in about 4 weeks (around 10/03/2023) for BP check, lab review.   Billie Lade, MD

## 2023-09-05 NOTE — Assessment & Plan Note (Signed)
-   Tdap vaccine administered today.

## 2023-09-05 NOTE — Patient Instructions (Signed)
It was a pleasure to see you today.  Thank you for giving [place] the opportunity to be involved in your care.  Below is a brief recap of your visit and next steps.  We will plan to see you again in 4-6 weeks.  Summary You have established care today We will check basic labs Referrals placed to gastroenterology and lung cancer screening Tdap vaccine updated today Follow up in 4-6 weeks for BP check and lab review

## 2023-09-05 NOTE — Assessment & Plan Note (Signed)
He has never been screened for colon cancer.  Denies a family history of colon cancer.  Gastroenterology referral placed today for screening colonoscopy.

## 2023-09-05 NOTE — Assessment & Plan Note (Signed)
He currently smokes 1 pack/day of cigarettes and has been smoking since age 57.  He is contemplating cessation and would potentially be interested in trying nicotine patches. -The patient was counseled on the dangers of tobacco use, and was advised to quit.  Reviewed strategies to maximize success, including removing cigarettes and smoking materials from environment, stress management, substitution of other forms of reinforcement, support of family/friends, and written materials. -I recommended choosing a quit date and notifying our office when he is ready to quit.  I am happy to prescribe nicotine replacement therapy at that time. -Meets criteria for lung cancer screening. Will message lung cancer screening program coordinator today.

## 2023-09-06 ENCOUNTER — Encounter (INDEPENDENT_AMBULATORY_CARE_PROVIDER_SITE_OTHER): Payer: Self-pay | Admitting: *Deleted

## 2023-09-06 ENCOUNTER — Other Ambulatory Visit: Payer: Self-pay | Admitting: Internal Medicine

## 2023-09-06 DIAGNOSIS — E559 Vitamin D deficiency, unspecified: Secondary | ICD-10-CM

## 2023-09-06 LAB — CBC WITH DIFFERENTIAL/PLATELET
Basophils Absolute: 0.1 10*3/uL (ref 0.0–0.2)
Basos: 1 %
EOS (ABSOLUTE): 0.4 10*3/uL (ref 0.0–0.4)
Eos: 6 %
Hematocrit: 41.9 % (ref 37.5–51.0)
Hemoglobin: 14 g/dL (ref 13.0–17.7)
Immature Grans (Abs): 0 10*3/uL (ref 0.0–0.1)
Immature Granulocytes: 0 %
Lymphocytes Absolute: 3.1 10*3/uL (ref 0.7–3.1)
Lymphs: 41 %
MCH: 34.1 pg — ABNORMAL HIGH (ref 26.6–33.0)
MCHC: 33.4 g/dL (ref 31.5–35.7)
MCV: 102 fL — ABNORMAL HIGH (ref 79–97)
Monocytes Absolute: 0.4 10*3/uL (ref 0.1–0.9)
Monocytes: 6 %
Neutrophils Absolute: 3.6 10*3/uL (ref 1.4–7.0)
Neutrophils: 46 %
Platelets: 243 10*3/uL (ref 150–450)
RBC: 4.11 x10E6/uL — ABNORMAL LOW (ref 4.14–5.80)
RDW: 11.3 % — ABNORMAL LOW (ref 11.6–15.4)
WBC: 7.6 10*3/uL (ref 3.4–10.8)

## 2023-09-06 LAB — LIPID PANEL
Chol/HDL Ratio: 5.3 {ratio} — ABNORMAL HIGH (ref 0.0–5.0)
Cholesterol, Total: 267 mg/dL — ABNORMAL HIGH (ref 100–199)
HDL: 50 mg/dL (ref >39)
LDL Chol Calc (NIH): 177 mg/dL — ABNORMAL HIGH (ref 0–99)
Triglycerides: 215 mg/dL — ABNORMAL HIGH (ref 0–149)
VLDL Cholesterol Cal: 40 mg/dL (ref 5–40)

## 2023-09-06 LAB — CMP14+EGFR
ALT: 48 [IU]/L — ABNORMAL HIGH (ref 0–44)
AST: 28 [IU]/L (ref 0–40)
Albumin: 4.6 g/dL (ref 3.8–4.9)
Alkaline Phosphatase: 79 [IU]/L (ref 44–121)
BUN/Creatinine Ratio: 17 (ref 9–20)
BUN: 16 mg/dL (ref 6–24)
Bilirubin Total: 0.3 mg/dL (ref 0.0–1.2)
CO2: 22 mmol/L (ref 20–29)
Calcium: 9.5 mg/dL (ref 8.7–10.2)
Chloride: 106 mmol/L (ref 96–106)
Creatinine, Ser: 0.96 mg/dL (ref 0.76–1.27)
Globulin, Total: 2.6 g/dL (ref 1.5–4.5)
Glucose: 88 mg/dL (ref 70–99)
Potassium: 4.3 mmol/L (ref 3.5–5.2)
Sodium: 140 mmol/L (ref 134–144)
Total Protein: 7.2 g/dL (ref 6.0–8.5)
eGFR: 93 mL/min/{1.73_m2} (ref >59)

## 2023-09-06 LAB — HEMOGLOBIN A1C
Est. average glucose Bld gHb Est-mCnc: 114 mg/dL
Hgb A1c MFr Bld: 5.6 % (ref 4.8–5.6)

## 2023-09-06 LAB — HCV AB W REFLEX TO QUANT PCR: HCV Ab: NONREACTIVE

## 2023-09-06 LAB — TSH+FREE T4
Free T4: 1.22 ng/dL (ref 0.82–1.77)
TSH: 0.961 u[IU]/mL (ref 0.450–4.500)

## 2023-09-06 LAB — B12 AND FOLATE PANEL
Folate: 9.6 ng/mL (ref >3.0)
Vitamin B-12: 714 pg/mL (ref 232–1245)

## 2023-09-06 LAB — HCV INTERPRETATION

## 2023-09-06 LAB — VITAMIN D 25 HYDROXY (VIT D DEFICIENCY, FRACTURES): Vit D, 25-Hydroxy: 7.7 ng/mL — ABNORMAL LOW (ref 30.0–100.0)

## 2023-09-06 LAB — HIV ANTIBODY (ROUTINE TESTING W REFLEX): HIV Screen 4th Generation wRfx: NONREACTIVE

## 2023-09-06 MED ORDER — VITAMIN D (ERGOCALCIFEROL) 1.25 MG (50000 UNIT) PO CAPS: 50000.0000 [IU] | ORAL_CAPSULE | ORAL | 0 refills | Status: DC

## 2023-10-03 ENCOUNTER — Ambulatory Visit: Payer: BC Managed Care – PPO | Admitting: Internal Medicine

## 2023-10-18 ENCOUNTER — Ambulatory Visit: Payer: BC Managed Care – PPO | Admitting: Family Medicine

## 2023-11-08 ENCOUNTER — Encounter: Payer: Self-pay | Admitting: Internal Medicine

## 2023-11-08 ENCOUNTER — Ambulatory Visit: Payer: BC Managed Care – PPO | Admitting: Internal Medicine

## 2023-11-08 VITALS — BP 138/88 | HR 75 | Ht 68.0 in | Wt 178.0 lb

## 2023-11-08 DIAGNOSIS — M545 Low back pain, unspecified: Secondary | ICD-10-CM | POA: Diagnosis not present

## 2023-11-08 DIAGNOSIS — M546 Pain in thoracic spine: Secondary | ICD-10-CM | POA: Diagnosis not present

## 2023-11-08 DIAGNOSIS — E559 Vitamin D deficiency, unspecified: Secondary | ICD-10-CM | POA: Diagnosis not present

## 2023-11-08 DIAGNOSIS — E782 Mixed hyperlipidemia: Secondary | ICD-10-CM | POA: Diagnosis not present

## 2023-11-08 MED ORDER — ATORVASTATIN CALCIUM 10 MG PO TABS: 10.0000 mg | ORAL_TABLET | Freq: Every day | ORAL | 3 refills | Status: AC

## 2023-11-08 NOTE — Assessment & Plan Note (Signed)
Lipid panel updated in December.  Her cholesterol 267 and LDL 177.  His 10-year ASCVD risk is 14.5%.  We discussed indications for moderate intensity statin therapy vs attempted dietary changes.  He prefers to start medication.  Atorvastatin 10 mg daily prescribed today.  Repeat lipid panel at follow-up in 3 months.

## 2023-11-08 NOTE — Assessment & Plan Note (Signed)
Left-sided thoracolumbar back pain x 1 week.  ROM at the waist is generally intact.  There is tenderness to palpation over the paraspinal muscles on the left thoracolumbar region.  No red flag symptoms identified.  Treatment options reviewed.  Recommend Tylenol/NSAIDs as needed for pain relief.  Home PT exercises provided.

## 2023-11-08 NOTE — Assessment & Plan Note (Signed)
Noted on recent labs.  High-dose, weekly vitamin D supplementation was prescribed but not filled.  He has started daily vitamin D supplementation.  Repeat vitamin D level at follow-up in 3 months.

## 2023-11-08 NOTE — Progress Notes (Signed)
Established Patient Office Visit  Subjective   Patient ID: Justin Compton, male    DOB: Jan 30, 1966  Age: 58 y.o. MRN: [id number]  Chief Complaint  Patient presents with   Care Management    Four week follow up    Back Pain    Back pain for one week    Justin Compton returns to care today for follow-up.  He was last evaluated by me in December 2024 as a new patient presenting to establish care.  His blood pressure was elevated at that time and 4-6-week follow-up was arranged for BP check and lab review.  There have been no acute interval events.  Justin Compton reports feeling fairly well today but endorses a 1 week history of pain in his left lower back.  Pain is worse with lateral movements at the waist and with standing abruptly.  He is unaware of any significant trauma or inciting event at the onset of injury.  No red flag symptoms identified.  He does not have any acute concerns to discuss today.  Past Medical History:  Diagnosis Date   Kidney stones 12/2022   History reviewed. No pertinent surgical history. Social History   Tobacco Use   Smoking status: Every Day    Current packs/day: 1.00    Average packs/day: 1 pack/day for 29.0 years (29.0 ttl pk-yrs)    Types: Cigarettes    Start date: 11/17/1994   Smokeless tobacco: Never  Substance Use Topics   Alcohol use: Yes   Drug use: Never   Family History  Problem Relation Age of Onset   Diabetes Mother    Hypertension Mother    Liver cancer Father    Hypertension Father    Diabetes Sister    Hypertension Brother    Diabetes Maternal Grandmother    Diabetes Maternal Grandfather    Lung disease Paternal Grandfather    No Known Allergies  Review of Systems  Constitutional:  Negative for chills and fever.  HENT:  Negative for sore throat.   Respiratory:  Negative for cough and shortness of breath.   Cardiovascular:  Negative for chest pain, palpitations and leg swelling.  Gastrointestinal:  Negative for abdominal pain,  blood in stool, constipation, diarrhea, nausea and vomiting.  Genitourinary:  Negative for dysuria and hematuria.  Musculoskeletal:  Positive for back pain (Left lower back pain). Negative for myalgias.  Skin:  Negative for itching and rash.  Neurological:  Negative for dizziness and headaches.  Psychiatric/Behavioral:  Negative for depression and suicidal ideas.      Objective:     BP 138/88 (BP Location: Right Arm, Patient Position: Sitting, Cuff Size: Normal)   Pulse 75   Ht 5\' 8"  (1.727 m)   Wt 178 lb (80.7 kg)   SpO2 97%   BMI 27.06 kg/m  BP Readings from Last 3 Encounters:  11/08/23 138/88  09/05/23 (!) 162/96  02/15/21 (!) 162/96   Physical Exam Vitals reviewed.  Constitutional:      General: He is not in acute distress.    Appearance: Normal appearance. He is not ill-appearing.  HENT:     Head: Normocephalic and atraumatic.     Right Ear: External ear normal.     Left Ear: External ear normal.     Nose: Nose normal. No congestion or rhinorrhea.     Mouth/Throat:     Mouth: Mucous membranes are moist.     Pharynx: Oropharynx is clear.  Eyes:     General: No scleral  icterus.    Extraocular Movements: Extraocular movements intact.     Conjunctiva/sclera: Conjunctivae normal.     Pupils: Pupils are equal, round, and reactive to light.  Cardiovascular:     Rate and Rhythm: Normal rate and regular rhythm.     Pulses: Normal pulses.     Heart sounds: Normal heart sounds. No murmur heard. Pulmonary:     Effort: Pulmonary effort is normal.     Breath sounds: Normal breath sounds. No wheezing, rhonchi or rales.  Abdominal:     General: Abdomen is flat. Bowel sounds are normal. There is no distension.     Palpations: Abdomen is soft.     Tenderness: There is no abdominal tenderness.  Musculoskeletal:        General: Tenderness (paraspinal muscles, L thoracolumbar region) present. No swelling or deformity. Normal range of motion.     Cervical back: Normal range of  motion.  Skin:    General: Skin is warm and dry.     Capillary Refill: Capillary refill takes less than 2 seconds.  Neurological:     General: No focal deficit present.     Mental Status: He is alert and oriented to person, place, and time.     Motor: No weakness.  Psychiatric:        Mood and Affect: Mood normal.        Behavior: Behavior normal.        Thought Content: Thought content normal.   Last CBC Lab Results  Component Value Date   WBC 7.6 09/05/2023   HGB 14.0 09/05/2023   HCT 41.9 09/05/2023   MCV 102 (H) 09/05/2023   MCH 34.1 (H) 09/05/2023   RDW 11.3 (L) 09/05/2023   PLT 243 09/05/2023   Last metabolic panel Lab Results  Component Value Date   GLUCOSE 88 09/05/2023   NA 140 09/05/2023   K 4.3 09/05/2023   CL 106 09/05/2023   CO2 22 09/05/2023   BUN 16 09/05/2023   CREATININE 0.96 09/05/2023   EGFR 93 09/05/2023   CALCIUM 9.5 09/05/2023   PROT 7.2 09/05/2023   ALBUMIN 4.6 09/05/2023   LABGLOB 2.6 09/05/2023   BILITOT 0.3 09/05/2023   ALKPHOS 79 09/05/2023   AST 28 09/05/2023   ALT 48 (H) 09/05/2023   Last lipids Lab Results  Component Value Date   CHOL 267 (H) 09/05/2023   HDL 50 09/05/2023   LDLCALC 177 (H) 09/05/2023   TRIG 215 (H) 09/05/2023   CHOLHDL 5.3 (H) 09/05/2023   Last hemoglobin A1c Lab Results  Component Value Date   HGBA1C 5.6 09/05/2023   Last thyroid functions Lab Results  Component Value Date   TSH 0.961 09/05/2023   Last vitamin D Lab Results  Component Value Date   VD25OH 7.7 (L) 09/05/2023   Last vitamin B12 and Folate Lab Results  Component Value Date   VITAMINB12 714 09/05/2023   FOLATE 9.6 09/05/2023   The 10-year ASCVD risk score (Arnett DK, et al., 2019) is: 14.5%    Assessment & Plan:   Problem List Items Addressed This Visit       Mixed hyperlipidemia - Primary   Lipid panel updated in December.  Her cholesterol 267 and LDL 177.  His 10-year ASCVD risk is 14.5%.  We discussed indications for  moderate intensity statin therapy vs attempted dietary changes.  He prefers to start medication.  Atorvastatin 10 mg daily prescribed today.  Repeat lipid panel at follow-up in 3 months.  Vitamin D deficiency   Noted on recent labs.  High-dose, weekly vitamin D supplementation was prescribed but not filled.  He has started daily vitamin D supplementation.  Repeat vitamin D level at follow-up in 3 months.      Thoracolumbar back pain   Left-sided thoracolumbar back pain x 1 week.  ROM at the waist is generally intact.  There is tenderness to palpation over the paraspinal muscles on the left thoracolumbar region.  No red flag symptoms identified.  Treatment options reviewed.  Recommend Tylenol/NSAIDs as needed for pain relief.  Home PT exercises provided.       Return in about 3 months (around 02/05/2024).    Billie Lade, MD

## 2023-11-08 NOTE — Patient Instructions (Signed)
It was a pleasure to see you today.  Thank you for giving [place] the opportunity to be involved in your care.  Below is a brief recap of your visit and next steps.  We will plan to see you again in 3 months.  Summary Start atorvastatin 10 mg daily for high cholesterol Continue daily vitamin D supplement Please see the attached exercises for your back Follow up in 3 months

## 2024-01-01 NOTE — Progress Notes (Signed)
 [facility] referral received. Attempted to reach patient but was unable to do so, message left with patient's wife who will have the patient call me back at a later date.

## 2024-01-02 NOTE — Progress Notes (Signed)
[facility] referral received. Attempted to reach patient but was unable to do so. Detailed VM left asking that the patient return my call. ? ?

## 2024-01-03 NOTE — Progress Notes (Signed)
[facility] referral received. Attempted to reach patient but was unable to do so. Detailed VM left asking that the patient return my call. ? ?

## 2024-01-03 NOTE — Progress Notes (Signed)
 Due to multiple unsuccessful attempts to reach patient regarding [facility] referral, referral has been cancelled at this time

## 2024-02-07 ENCOUNTER — Ambulatory Visit: Payer: BC Managed Care – PPO | Admitting: Internal Medicine

## 2024-03-12 ENCOUNTER — Encounter (INDEPENDENT_AMBULATORY_CARE_PROVIDER_SITE_OTHER): Payer: Self-pay | Admitting: *Deleted
